# Patient Record
Sex: Female | Born: 1965 | Race: Black or African American | Hispanic: No | Marital: Married | Smoking: Current every day smoker
Health system: Southern US, Community
[De-identification: ages and names within clinical notes are randomized; demographics above are authoritative.]

## PROBLEM LIST (undated history)

## (undated) DIAGNOSIS — M542 Cervicalgia: Secondary | ICD-10-CM

## (undated) DIAGNOSIS — G43709 Chronic migraine without aura, not intractable, without status migrainosus: Secondary | ICD-10-CM

## (undated) DIAGNOSIS — D649 Anemia, unspecified: Secondary | ICD-10-CM

## (undated) DIAGNOSIS — K2100 Gastro-esophageal reflux disease with esophagitis, without bleeding: Secondary | ICD-10-CM

## (undated) DIAGNOSIS — K2970 Gastritis, unspecified, without bleeding: Secondary | ICD-10-CM

## (undated) HISTORY — PX: OTHER SURGICAL HISTORY: SHX169

## (undated) HISTORY — DX: Cervicalgia: M54.2

## (undated) HISTORY — DX: Chronic migraine without aura, not intractable, without status migrainosus: G43.709

## (undated) HISTORY — DX: Gastro-esophageal reflux disease with esophagitis: K21.0

## (undated) HISTORY — DX: Gastro-esophageal reflux disease with esophagitis, without bleeding: K21.00

## (undated) HISTORY — DX: Anemia, unspecified: D64.9

---

## 2009-11-02 ENCOUNTER — Emergency Department (HOSPITAL_COMMUNITY): Admission: EM | Admit: 2009-11-02 | Discharge: 2009-11-02 | Payer: Self-pay | Admitting: Emergency Medicine

## 2010-01-20 ENCOUNTER — Emergency Department (HOSPITAL_COMMUNITY)
Admission: EM | Admit: 2010-01-20 | Discharge: 2010-01-20 | Payer: Self-pay | Source: Home / Self Care | Admitting: Emergency Medicine

## 2010-01-21 ENCOUNTER — Emergency Department (HOSPITAL_COMMUNITY)
Admission: EM | Admit: 2010-01-21 | Discharge: 2010-01-21 | Payer: Self-pay | Source: Home / Self Care | Admitting: Emergency Medicine

## 2010-01-27 LAB — COMPREHENSIVE METABOLIC PANEL
ALT: 17 U/L (ref 0–35)
AST: 21 U/L (ref 0–37)
Albumin: 4.2 g/dL (ref 3.5–5.2)
Alkaline Phosphatase: 82 U/L (ref 39–117)
BUN: 8 mg/dL (ref 6–23)
CO2: 26 mEq/L (ref 19–32)
Calcium: 9.6 mg/dL (ref 8.4–10.5)
Chloride: 99 mEq/L (ref 96–112)
Creatinine, Ser: 0.7 mg/dL (ref 0.4–1.2)
GFR calc Af Amer: >60 mL/min (ref >60)
GFR calc non Af Amer: >60 mL/min (ref >60)
Glucose, Bld: 117 mg/dL — ABNORMAL HIGH (ref 70–99)
Potassium: 3.5 mEq/L (ref 3.5–5.1)
Sodium: 137 mEq/L (ref 135–145)
Total Bilirubin: 0.4 mg/dL (ref 0.3–1.2)
Total Protein: 8.1 g/dL (ref 6.0–8.3)

## 2010-01-27 LAB — DIFFERENTIAL
Basophils Absolute: 0 10*3/uL (ref 0.0–0.1)
Basophils Relative: 0 % (ref 0–1)
Eosinophils Absolute: 0 10*3/uL (ref 0.0–0.7)
Eosinophils Relative: 0 % (ref 0–5)
Lymphocytes Relative: 9 % — ABNORMAL LOW (ref 12–46)
Lymphs Abs: 0.9 10*3/uL (ref 0.7–4.0)
Monocytes Absolute: 0.4 10*3/uL (ref 0.1–1.0)
Monocytes Relative: 4 % (ref 3–12)
Neutro Abs: 8.5 10*3/uL — ABNORMAL HIGH (ref 1.7–7.7)
Neutrophils Relative %: 87 % — ABNORMAL HIGH (ref 43–77)

## 2010-01-27 LAB — URINALYSIS, ROUTINE W REFLEX MICROSCOPIC
Bilirubin Urine: NEGATIVE
Ketones, ur: >80 mg/dL — AB
Nitrite: NEGATIVE
Protein, ur: 30 mg/dL — AB
Specific Gravity, Urine: 1.027 (ref 1.005–1.030)
Urine Glucose, Fasting: NEGATIVE mg/dL
Urobilinogen, UA: 0.2 mg/dL (ref 0.0–1.0)
pH: 7.5 (ref 5.0–8.0)

## 2010-01-27 LAB — CBC
HCT: 37 % (ref 36.0–46.0)
Hemoglobin: 12.1 g/dL (ref 12.0–15.0)
MCH: 28.1 pg (ref 26.0–34.0)
MCHC: 32.7 g/dL (ref 30.0–36.0)
MCV: 86 fL (ref 78.0–100.0)
Platelets: 391 10*3/uL (ref 150–400)
RBC: 4.3 MIL/uL (ref 3.87–5.11)
RDW: 14.3 % (ref 11.5–15.5)
WBC: 9.8 10*3/uL (ref 4.0–10.5)

## 2010-01-27 LAB — POCT PREGNANCY, URINE: Preg Test, Ur: NEGATIVE

## 2010-01-27 LAB — LIPASE, BLOOD: Lipase: 31 U/L (ref 11–59)

## 2010-01-27 LAB — URINE MICROSCOPIC-ADD ON

## 2010-05-10 ENCOUNTER — Emergency Department (HOSPITAL_COMMUNITY)
Admission: EM | Admit: 2010-05-10 | Discharge: 2010-05-10 | Disposition: A | Discharge status: Home / Self Care | Payer: Self-pay | Attending: Emergency Medicine | Admitting: Emergency Medicine

## 2010-05-10 DIAGNOSIS — R300 Dysuria: Secondary | ICD-10-CM | POA: Insufficient documentation

## 2010-05-10 DIAGNOSIS — R109 Unspecified abdominal pain: Secondary | ICD-10-CM | POA: Insufficient documentation

## 2010-05-10 DIAGNOSIS — N12 Tubulo-interstitial nephritis, not specified as acute or chronic: Secondary | ICD-10-CM | POA: Insufficient documentation

## 2010-05-10 DIAGNOSIS — R350 Frequency of micturition: Secondary | ICD-10-CM | POA: Insufficient documentation

## 2010-05-10 LAB — URINALYSIS, ROUTINE W REFLEX MICROSCOPIC
Bilirubin Urine: NEGATIVE
Glucose, UA: NEGATIVE mg/dL
Ketones, ur: NEGATIVE mg/dL
Nitrite: NEGATIVE
Protein, ur: NEGATIVE mg/dL
Specific Gravity, Urine: 1.021 (ref 1.005–1.030)
Urobilinogen, UA: 0.2 mg/dL (ref 0.0–1.0)
pH: 6.5 (ref 5.0–8.0)

## 2010-05-10 LAB — POCT PREGNANCY, URINE: Preg Test, Ur: NEGATIVE

## 2010-05-10 LAB — URINE MICROSCOPIC-ADD ON

## 2011-12-11 ENCOUNTER — Encounter (HOSPITAL_COMMUNITY): Payer: Self-pay | Admitting: *Deleted

## 2011-12-11 ENCOUNTER — Emergency Department (HOSPITAL_COMMUNITY): Payer: Self-pay

## 2011-12-11 ENCOUNTER — Emergency Department (HOSPITAL_COMMUNITY)
Admission: EM | Admit: 2011-12-11 | Discharge: 2011-12-11 | Disposition: A | Discharge status: Home / Self Care | Payer: Self-pay | Attending: Emergency Medicine | Admitting: Emergency Medicine

## 2011-12-11 DIAGNOSIS — F17200 Nicotine dependence, unspecified, uncomplicated: Secondary | ICD-10-CM | POA: Insufficient documentation

## 2011-12-11 DIAGNOSIS — R109 Unspecified abdominal pain: Secondary | ICD-10-CM | POA: Insufficient documentation

## 2011-12-11 DIAGNOSIS — R110 Nausea: Secondary | ICD-10-CM | POA: Insufficient documentation

## 2011-12-11 HISTORY — DX: Gastritis, unspecified, without bleeding: K29.70

## 2011-12-11 LAB — URINALYSIS, ROUTINE W REFLEX MICROSCOPIC
Bilirubin Urine: NEGATIVE
Glucose, UA: NEGATIVE mg/dL
Hgb urine dipstick: NEGATIVE
Ketones, ur: NEGATIVE mg/dL
Nitrite: NEGATIVE
Protein, ur: NEGATIVE mg/dL
Specific Gravity, Urine: 1.021 (ref 1.005–1.030)
Urobilinogen, UA: 0.2 mg/dL (ref 0.0–1.0)
pH: 7.5 (ref 5.0–8.0)

## 2011-12-11 LAB — CBC WITH DIFFERENTIAL/PLATELET
Basophils Absolute: 0 10*3/uL (ref 0.0–0.1)
Basophils Relative: 0 % (ref 0–1)
Eosinophils Absolute: 0.1 10*3/uL (ref 0.0–0.7)
Eosinophils Relative: 2 % (ref 0–5)
HCT: 36.1 % (ref 36.0–46.0)
Hemoglobin: 11.6 g/dL — ABNORMAL LOW (ref 12.0–15.0)
Lymphocytes Relative: 44 % (ref 12–46)
Lymphs Abs: 2.2 10*3/uL (ref 0.7–4.0)
MCH: 27 pg (ref 26.0–34.0)
MCHC: 32.1 g/dL (ref 30.0–36.0)
MCV: 84 fL (ref 78.0–100.0)
Monocytes Absolute: 0.5 10*3/uL (ref 0.1–1.0)
Monocytes Relative: 9 % (ref 3–12)
Neutro Abs: 2.2 10*3/uL (ref 1.7–7.7)
Neutrophils Relative %: 45 % (ref 43–77)
Platelets: 386 10*3/uL (ref 150–400)
RBC: 4.3 MIL/uL (ref 3.87–5.11)
RDW: 14.9 % (ref 11.5–15.5)
WBC: 4.9 10*3/uL (ref 4.0–10.5)

## 2011-12-11 LAB — COMPREHENSIVE METABOLIC PANEL
ALT: 9 U/L (ref 0–35)
AST: 17 U/L (ref 0–37)
Albumin: 3.5 g/dL (ref 3.5–5.2)
Alkaline Phosphatase: 77 U/L (ref 39–117)
BUN: 12 mg/dL (ref 6–23)
CO2: 24 mEq/L (ref 19–32)
Calcium: 9.3 mg/dL (ref 8.4–10.5)
Chloride: 101 mEq/L (ref 96–112)
Creatinine, Ser: 0.71 mg/dL (ref 0.50–1.10)
GFR calc Af Amer: >90 mL/min (ref >90)
GFR calc non Af Amer: >90 mL/min (ref >90)
Glucose, Bld: 89 mg/dL (ref 70–99)
Potassium: 4 mEq/L (ref 3.5–5.1)
Sodium: 135 mEq/L (ref 135–145)
Total Bilirubin: 0.2 mg/dL — ABNORMAL LOW (ref 0.3–1.2)
Total Protein: 7.6 g/dL (ref 6.0–8.3)

## 2011-12-11 LAB — URINE MICROSCOPIC-ADD ON

## 2011-12-11 LAB — PREGNANCY, URINE: Preg Test, Ur: NEGATIVE

## 2011-12-11 MED ORDER — PROMETHAZINE HCL 25 MG/ML IJ SOLN: 12.5000 mg | Freq: Once | INTRAMUSCULAR | Status: DC

## 2011-12-11 MED ORDER — OXYCODONE-ACETAMINOPHEN 5-325 MG PO TABS: 1.0000 | ORAL_TABLET | Freq: Four times a day (QID) | ORAL | Status: DC | PRN

## 2011-12-11 MED ORDER — MORPHINE SULFATE 4 MG/ML IJ SOLN
4.0000 mg | Freq: Once | INTRAMUSCULAR | Status: AC
  Administered 2011-12-11: 4 mg via INTRAVENOUS
  Filled 2011-12-11: qty 1

## 2011-12-11 MED ORDER — SODIUM CHLORIDE 0.9 % IV BOLUS (SEPSIS)
1000.0000 mL | Freq: Once | INTRAVENOUS | Status: AC
  Administered 2011-12-11: 1000 mL via INTRAVENOUS

## 2011-12-11 MED ORDER — ONDANSETRON HCL 4 MG/2ML IJ SOLN
4.0000 mg | Freq: Once | INTRAMUSCULAR | Status: AC
  Administered 2011-12-11: 4 mg via INTRAVENOUS
  Filled 2011-12-11: qty 2

## 2011-12-11 MED ORDER — CIPROFLOXACIN HCL 500 MG PO TABS: 500.0000 mg | ORAL_TABLET | Freq: Two times a day (BID) | ORAL | Status: DC

## 2011-12-11 NOTE — ED Notes (Signed)
Pt states that 3 days ago she began to have severe L lower back and a L hip pain that radiates down her thigh.  No distress noted.  States severe pain with urination and pain with bowel movements (the pain is in her back).  States that her stools have been black.  Last BM today.  Denies pain with urination.

## 2011-12-11 NOTE — ED Notes (Signed)
Pt C/o severe pain in her hip.  Initially she was refusing the morphine due the "feeling" it gave.  But pt became to uncomfortable.  Morphine IV administered.  No distress noted.  Pt placed on SpO2 monitoring for safety due to narcotics.

## 2011-12-11 NOTE — ED Notes (Signed)
Patient transported to CT 

## 2011-12-11 NOTE — ED Provider Notes (Signed)
History     CSN: [id number]  Arrival date & time 12/11/11  7829   First MD Initiated Contact with Patient 12/11/11 [phone]      Chief Complaint  Patient presents with  . Hip Pain    (Consider location/radiation/quality/duration/timing/severity/associated sxs/prior treatment) HPI Comments: Patient presents with pain in the right lower abdomen and flank that started two days ago.  The pain is constant and now becoming quite severe.  She is having difficulty lying on her back and standing up.  These positions seem to worsen the pain.  She denies bowel or bladder complaints.  Patient is a 46 y.o. female presenting with abdominal pain. The history is provided by the patient.  Abdominal Pain The primary symptoms of the illness include abdominal pain and nausea. The primary symptoms of the illness do not include fever, vomiting, diarrhea, dysuria or vaginal discharge. The current episode started 2 days ago. The onset of the illness was gradual. The problem has been rapidly worsening.  The patient states that she believes she is currently not pregnant. The patient has not had a change in bowel habit. Symptoms associated with the illness do not include chills, constipation, urgency or frequency.    Past Medical History  Diagnosis Date  . Gastritis     No past surgical history on file.  No family history on file.  History  Substance Use Topics  . Smoking status: Current Every Day Smoker  . Smokeless tobacco: Not on file  . Alcohol Use: No    OB History    Grav Para Term Preterm Abortions TAB SAB Ect Mult Living                  Review of Systems  Constitutional: Negative for fever and chills.  Gastrointestinal: Positive for nausea and abdominal pain. Negative for vomiting, diarrhea and constipation.  Genitourinary: Negative for dysuria, urgency, frequency and vaginal discharge.  All other systems reviewed and are negative.    Allergies  Aspirin  Home Medications    Current Outpatient Rx  Name  Route  Sig  Dispense  Refill  . ACETAMINOPHEN 500 MG PO TABS   Oral   Take 1,500 mg by mouth 3 (three) times daily as needed. For pain         . NAPROXEN SODIUM 220 MG PO TABS   Oral   Take 660 mg by mouth 2 (two) times daily as needed. For pain           BP 128/65  Pulse 84  Temp 98.1 F (36.7 C) (Oral)  Resp 18  Ht 5\' 3"  (1.6 m)  Wt 180 lb (81.647 kg)  BMI 31.89 kg/m2  LMP 12/04/2011  Physical Exam  Nursing note and vitals reviewed. Constitutional: She is oriented to person, place, and time. She appears well-developed and well-nourished. No distress.  HENT:  Head: Normocephalic and atraumatic.  Neck: Normal range of motion. Neck supple.  Cardiovascular: Normal rate and regular rhythm.  Exam reveals no gallop and no friction rub.   No murmur heard. Pulmonary/Chest: Effort normal and breath sounds normal. No respiratory distress. She has no wheezes.  Abdominal: Soft. Bowel sounds are normal. She exhibits no distension.       There is ttp in the right lower abdomen and flank.  There is no rebound but she is exhibiting voluntary guarding.  Musculoskeletal: Normal range of motion.  Neurological: She is alert and oriented to person, place, and time.  Skin: Skin is warm and  dry. She is not diaphoretic.    ED Course  Procedures (including critical care time)   Labs Reviewed  CBC WITH DIFFERENTIAL  COMPREHENSIVE METABOLIC PANEL  URINALYSIS, ROUTINE W REFLEX MICROSCOPIC  PREGNANCY, URINE   No results found.   No diagnosis found.    MDM  The patient presents with right lower quadrant and flank pain, but workup including ct, ua, and labs are unremarkable.  There is a small amount of leukocytes in the urine.  This could potentially be related to pyelonephritis and I will treat with antibiotics and pain meds as I have found nothing else to explain her symptoms.          Geoffery Lyons, MD 12/11/11 [phone]

## 2012-03-01 ENCOUNTER — Emergency Department (INDEPENDENT_AMBULATORY_CARE_PROVIDER_SITE_OTHER): Payer: 59

## 2012-03-01 ENCOUNTER — Encounter (HOSPITAL_COMMUNITY): Payer: Self-pay | Admitting: Emergency Medicine

## 2012-03-01 ENCOUNTER — Emergency Department (HOSPITAL_COMMUNITY)
Admission: EM | Admit: 2012-03-01 | Discharge: 2012-03-01 | Disposition: A | Discharge status: Home / Self Care | Payer: 59 | Source: Home / Self Care

## 2012-03-01 DIAGNOSIS — S93409A Sprain of unspecified ligament of unspecified ankle, initial encounter: Secondary | ICD-10-CM

## 2012-03-01 DIAGNOSIS — S93401A Sprain of unspecified ligament of right ankle, initial encounter: Secondary | ICD-10-CM

## 2012-03-01 NOTE — ED Notes (Signed)
Reports right ankle pain after fall on ice.

## 2012-03-01 NOTE — ED Notes (Signed)
MD at bedside. 

## 2012-03-01 NOTE — ED Provider Notes (Signed)
Medical screening examination/treatment/procedure(s) were performed by resident physician or non-physician practitioner and as supervising physician I was immediately available for consultation/collaboration.   Abriella Filkins DOUGLAS MD.   Lenice Koper D Marcelis Wissner, MD 03/01/12 2001 

## 2012-03-01 NOTE — ED Provider Notes (Signed)
History     CSN: [id number]  Arrival date & time 03/01/12  1237   First MD Initiated Contact with Patient 03/01/12 1315      Chief Complaint  Patient presents with  . Fall    (Consider location/radiation/quality/duration/timing/severity/associated sxs/prior treatment) HPI Comments: This 47 year old female was in the parking lot at [employer] 4 days ago and slipped on the ice. She states that she injured her right ankle at that time that she continued to walk on her right lower extremity. The following 4 days she has continued to bear weight, ambulates and climbing descend stairs. She is now complaining of persistent tenderness and swelling of the ankle. She denies pain or tenderness of the foot. She also denies injury to the head neck, chest, back, abdomen or other joints.   Past Medical History  Diagnosis Date  . Gastritis     History reviewed. No pertinent past surgical history.  No family history on file.  History  Substance Use Topics  . Smoking status: Current Every Day Smoker  . Smokeless tobacco: Not on file  . Alcohol Use: No    OB History   Grav Para Term Preterm Abortions TAB SAB Ect Mult Living                  Review of Systems  Constitutional: Negative for fever, chills and activity change.  HENT: Negative.   Respiratory: Negative.   Cardiovascular: Negative.   Musculoskeletal:       As per HPI  Skin: Negative for color change, pallor and rash.  Neurological: Negative.     Allergies  Aspirin  Home Medications   Current Outpatient Rx  Name  Route  Sig  Dispense  Refill  . acetaminophen (TYLENOL) 500 MG tablet   Oral   Take 1,500 mg by mouth 3 (three) times daily as needed. For pain         . ciprofloxacin (CIPRO) 500 MG tablet   Oral   Take 1 tablet (500 mg total) by mouth every 12 (twelve) hours.   10 tablet   0   . naproxen sodium (ANAPROX) 220 MG tablet   Oral   Take 660 mg by mouth 2 (two) times daily as needed. For pain          . oxyCODONE-acetaminophen (PERCOCET/ROXICET) 5-325 MG per tablet   Oral   Take 1-2 tablets by mouth every 6 (six) hours as needed for pain.   20 tablet   0     BP 130/75  Pulse 100  Temp(Src) 98.2 F (36.8 C) (Oral)  Resp 18  SpO2 100%  LMP 02/16/2012  Physical Exam  Nursing note and vitals reviewed. Constitutional: She is oriented to person, place, and time. She appears well-developed and well-nourished. No distress.  HENT:  Head: Normocephalic and atraumatic.  Eyes: EOM are normal. Pupils are equal, round, and reactive to light.  Neck: Normal range of motion. Neck supple.  Cardiovascular: Normal rate.   Pulmonary/Chest: Effort normal.  Musculoskeletal:  There is tenderness over the right lateral malleolus and anterior ankle. There is also mild swelling which has improved since a day after the injury. Tenderness in the proximal ankle along the tendon of the posterior aspect of the lower most leg. It does not involve the calf muscle. No tenderness or swelling of the foot. She is able to dorsiflex, plantar flex and deviate medially and laterally with her ankle. Neurovascular and motor sensory is intact.  Lymphadenopathy:  She has no cervical adenopathy.  Neurological: She is alert and oriented to person, place, and time. No cranial nerve deficit.  Skin: Skin is warm and dry.  Psychiatric: Her mood appears anxious.    ED Course  Procedures (including critical care time)  Labs Reviewed - No data to display Dg Ankle Complete Right  03/01/2012  *RADIOLOGY REPORT*  Clinical Data: Pain post trauma  RIGHT ANKLE - COMPLETE 3+ VIEW  Comparison: None.  Findings:  Frontal, oblique, and lateral views were obtained. There is swelling laterally.  No fracture or effusion.  Ankle mortise appears intact.  There is a spur arising from the inferior calcaneus.  IMPRESSION: Swelling laterally.  No apparent fracture.  Mortise intact.   Original Report Authenticated By: Bretta Bang, M.D.       1. Ankle sprain, right, initial encounter       MDM  Rest, ice, wear the wrap for one week, keep elevated. Stay off of it as much as possible and use the crutches. Initially no weightbearing and then had weight as tolerated. Ibuprofen 600 mg every 6 hours as needed for pain.         Hayden Rasmussen, NP 03/01/12 [phone]

## 2012-05-05 ENCOUNTER — Encounter: Payer: Self-pay | Admitting: Nurse Practitioner

## 2012-07-20 ENCOUNTER — Encounter: Payer: Self-pay | Admitting: *Deleted

## 2012-07-20 ENCOUNTER — Ambulatory Visit (INDEPENDENT_AMBULATORY_CARE_PROVIDER_SITE_OTHER): Payer: 59 | Admitting: Nurse Practitioner

## 2012-07-20 ENCOUNTER — Encounter: Payer: Self-pay | Admitting: Nurse Practitioner

## 2012-07-20 VITALS — BP 124/88 | HR 71 | Temp 98.7°F | Resp 15 | Ht 63.0 in | Wt 177.0 lb

## 2012-07-20 DIAGNOSIS — H6690 Otitis media, unspecified, unspecified ear: Secondary | ICD-10-CM

## 2012-07-20 DIAGNOSIS — H6691 Otitis media, unspecified, right ear: Secondary | ICD-10-CM

## 2012-07-20 MED ORDER — AMOXICILLIN-POT CLAVULANATE 875-125 MG PO TABS: 1.0000 | ORAL_TABLET | Freq: Two times a day (BID) | ORAL | Status: DC

## 2012-07-20 NOTE — Progress Notes (Signed)
Patient ID: Candice Murray, female   DOB: [birth year], 47 y.o.   MRN: [id number]   Allergies  Allergen Reactions  . Aspirin Other (See Comments)    Causes stomach inflammation  . Codeine     Chief Complaint  Patient presents with  . Otalgia    Right Ear Pain    HPI: Patient is a 47 y.o. female seen in the office today for pain in right inner ear that is ongoing for 3-4 days.  Reports pain on side of face with headache. No fevers or chills, no drainage or hearing loss Reports her sinuses feel full on the side.   Review of Systems:  Review of Systems  Constitutional: Negative for fever and chills.  HENT: Positive for ear pain. Negative for congestion, sore throat, tinnitus and ear discharge.   Eyes: Negative.   Respiratory: Negative for shortness of breath.   Cardiovascular: Negative for chest pain.  Skin: Negative.      Past Medical History  Diagnosis Date  . Gastritis   . Reflux esophagitis   . Cervicalgia   . Chronic migraine without aura    Past Surgical History  Procedure Laterality Date  . Atopic pregnancy     Social History:   reports that she has been smoking.  She does not have any smokeless tobacco history on file. She reports that she uses illicit drugs (Marijuana). She reports that she does not drink alcohol.  History reviewed. No pertinent family history.  Medications: Patient's Medications  New Prescriptions   No medications on file  Previous Medications   ACETAMINOPHEN (TYLENOL) 500 MG TABLET    Take 1,500 mg by mouth 3 (three) times daily as needed. For pain   NAPROXEN SODIUM (ANAPROX) 220 MG TABLET    Take 660 mg by mouth 2 (two) times daily as needed. For pain   PANTOPRAZOLE (PROTONIX) 40 MG TABLET    Take 40 mg by mouth daily. Take 1 tablet daily to reduce stomach acid.   PROPRANOLOL (INDERAL) 40 MG TABLET    Take 40 mg by mouth 3 (three) times daily. Take 1 tablet twice daily to help control migraines.   SUMATRIPTAN (IMITREX) 50 MG TABLET    Take 50  mg by mouth every 2 (two) hours as needed for migraine. Take 1 tablet as needed with onset of migraine headache. May repeat x 1 after 2 hours -max of 4 pills in 24 hours.  Modified Medications   No medications on file  Discontinued Medications   CIPROFLOXACIN (CIPRO) 500 MG TABLET    Take 1 tablet (500 mg total) by mouth every 12 (twelve) hours.   OXYCODONE-ACETAMINOPHEN (PERCOCET/ROXICET) 5-325 MG PER TABLET    Take 1-2 tablets by mouth every 6 (six) hours as needed for pain.     Physical Exam:  Filed Vitals:   07/20/12 1531  BP: 124/88  Pulse: 71  Temp: 98.7 F (37.1 C)  TempSrc: Oral  Resp: 15  Height: 5\' 3"  (1.6 m)  Weight: 177 lb (80.287 kg)    Physical Exam  Vitals reviewed. Constitutional: She is well-developed, well-nourished, and in no distress. No distress.  HENT:  Right Ear: There is tenderness. No drainage or swelling. No foreign bodies. Tympanic membrane is not erythematous. No middle ear effusion.  Wax in bilateral canal   Cardiovascular: Normal rate, regular rhythm and normal heart sounds.   Pulmonary/Chest: Effort normal and breath sounds normal.  Skin: She is not diaphoretic.     Labs reviewed: Basic Metabolic Panel:  Recent Labs  12/11/11 0926  NA 135  K 4.0  CL 101  CO2 24  GLUCOSE 89  BUN 12  CREATININE 0.71  CALCIUM 9.3   Liver Function Tests:  Recent Labs  12/11/11 0926  AST 17  ALT 9  ALKPHOS 77  BILITOT 0.2*  PROT 7.6  ALBUMIN 3.5   No results found for this basename: LIPASE, AMYLASE,  in the last 8760 hours No results found for this basename: AMMONIA,  in the last 8760 hours CBC:  Recent Labs  12/11/11 0926  WBC 4.9  NEUTROABS 2.2  HGB 11.6*  HCT 36.1  MCV 84.0  PLT 386    Assessment/Plan  Otitis media-  rx given for Augmentin BID for 10 days; pt to follow up if pain has not resolved in 2 weeks or symptoms get worse including fevers or chills

## 2012-07-20 NOTE — Patient Instructions (Addendum)
Antibiotics sent to pharmacy  Take twice daily Taking with probiotic  Follow up in 2 weeks if not resolved  Otitis Media, Adult A middle ear infection is an infection in the space behind the eardrum. The medical name for this is "otitis media." It may happen after a common cold. It is caused by a germ that starts growing in that space. You may feel swollen glands in your neck on the side of the ear infection. HOME CARE INSTRUCTIONS   Take your medicine as directed until it is gone, even if you feel better after the first few days.  Only take over-the-counter or prescription medicines for pain, discomfort, or fever as directed by your caregiver.  Occasional use of a nasal decongestant a couple times per day may help with discomfort and help the eustachian tube to drain better. Follow up with your caregiver in 10 to 14 days or as directed, to be certain that the infection has cleared. Not keeping the appointment could result in a chronic or permanent injury, pain, hearing loss and disability. If there is any problem keeping the appointment, you must call back to this facility for assistance. SEEK IMMEDIATE MEDICAL CARE IF:   You are not getting better in 2 to 3 days.  You have pain that is not controlled with medication.  You feel worse instead of better.  You cannot use the medication as directed.  You develop swelling, redness or pain around the ear or stiffness in your neck. MAKE SURE YOU:   Understand these instructions.  Will watch your condition.  Will get help right away if you are not doing well or get worse. Document Released: 10/04/2003 Document Revised: 03/23/2011 Document Reviewed: 08/05/2007 [facility] Patient Information 2014 [place], [place].

## 2012-08-08 ENCOUNTER — Other Ambulatory Visit: Payer: Self-pay | Admitting: Geriatric Medicine

## 2012-08-08 MED ORDER — PANTOPRAZOLE SODIUM 40 MG PO TBEC: 40.0000 mg | DELAYED_RELEASE_TABLET | Freq: Every day | ORAL | Status: DC

## 2012-08-29 ENCOUNTER — Ambulatory Visit: Payer: Self-pay | Admitting: Nurse Practitioner

## 2012-08-30 ENCOUNTER — Ambulatory Visit: Payer: Self-pay | Admitting: Nurse Practitioner

## 2013-04-12 ENCOUNTER — Ambulatory Visit: Payer: Self-pay | Admitting: Nurse Practitioner

## 2013-04-19 ENCOUNTER — Ambulatory Visit: Payer: Self-pay | Admitting: Nurse Practitioner

## 2013-06-15 IMAGING — CT CT ABD-PELV W/O CM
2 of 4 series · 17 of 46 positions shown, 19 images · non-contrast
Comparison: None.

CLINICAL DATA: Severe left lower back pain and left hip pain.  Pain
with urination.

CT ABDOMEN AND PELVIS WITHOUT CONTRAST
TECHNIQUE: Multidetector CT imaging of the abdomen and pelvis was
performed following the standard protocol without intravenous
contrast.

[Series 2: stone 130 5.0 b31f st · axial · 0.68mm/px · z∈[-400,+20]mm · 14 of 92 slices shown, 16 images]
[im 4/92  soft-tissue]
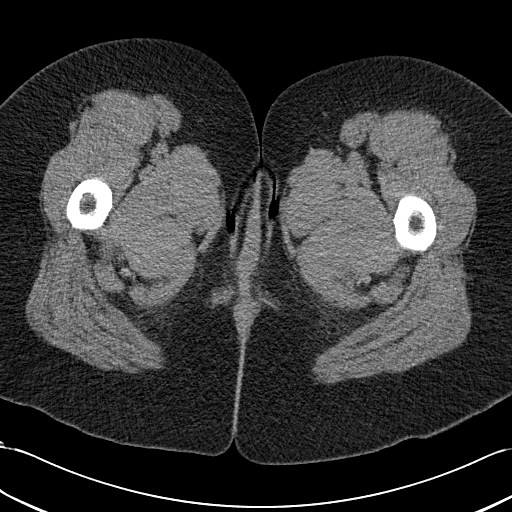
[im 4/92  bone]
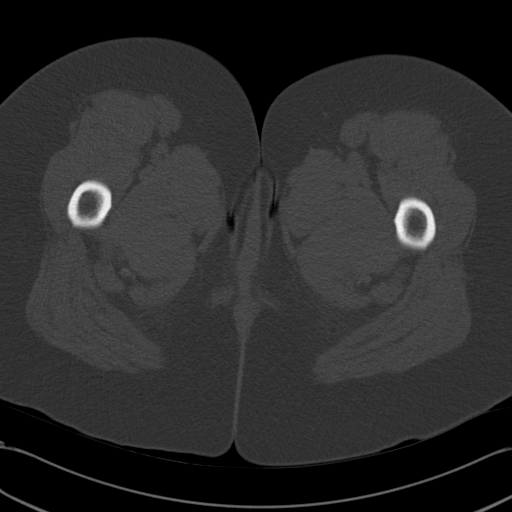
[im 12/92  soft-tissue]
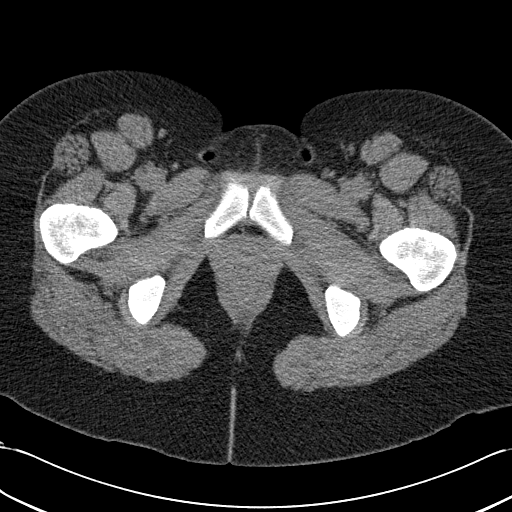
[im 19/92  soft-tissue]
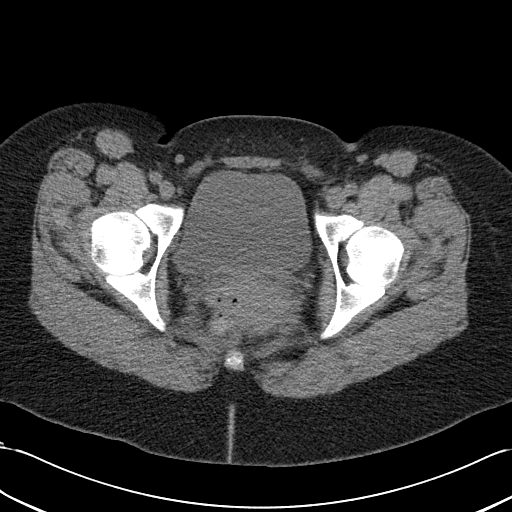
[im 23/92  soft-tissue]
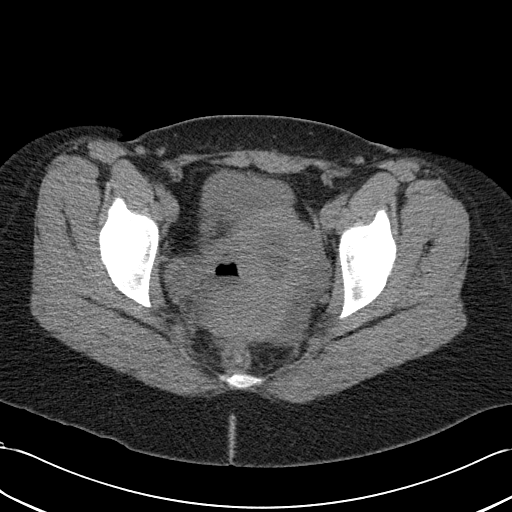
[im 31/92  soft-tissue]
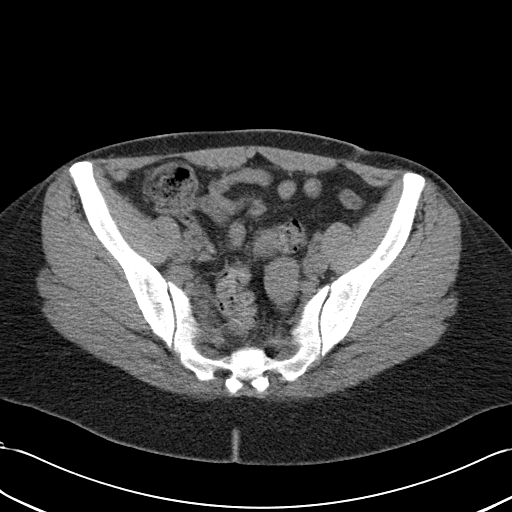
[im 38/92  soft-tissue]
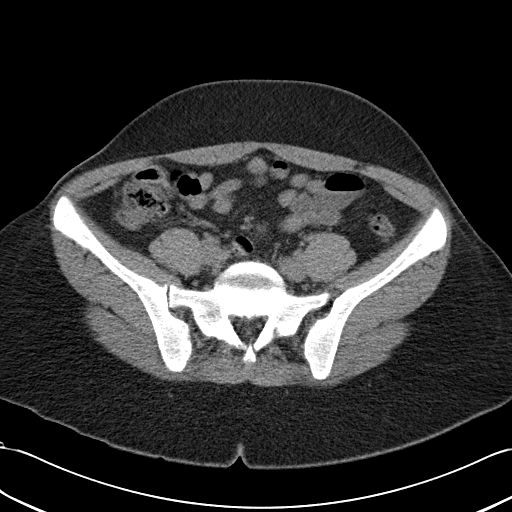
[im 42/92  soft-tissue]
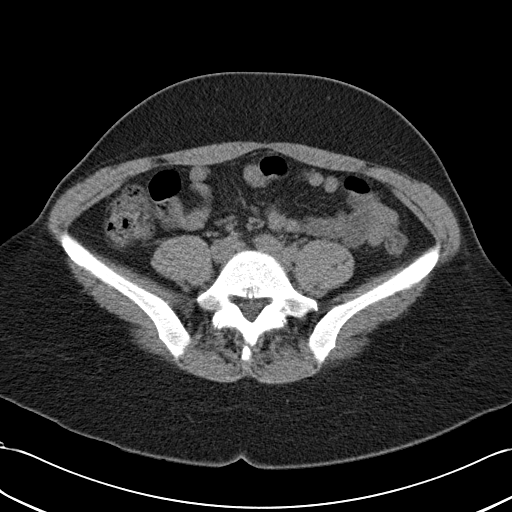
[im 50/92  soft-tissue]
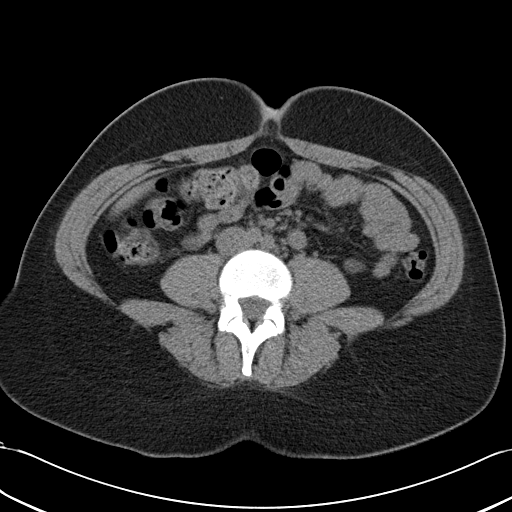
[im 54/92  soft-tissue]
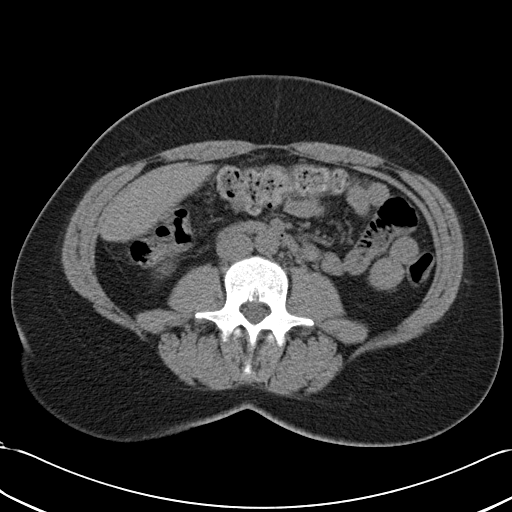
[im 54/92  bone]
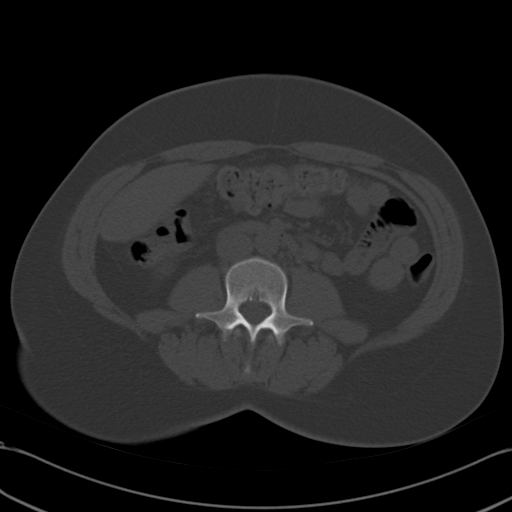
[im 61/92  soft-tissue]
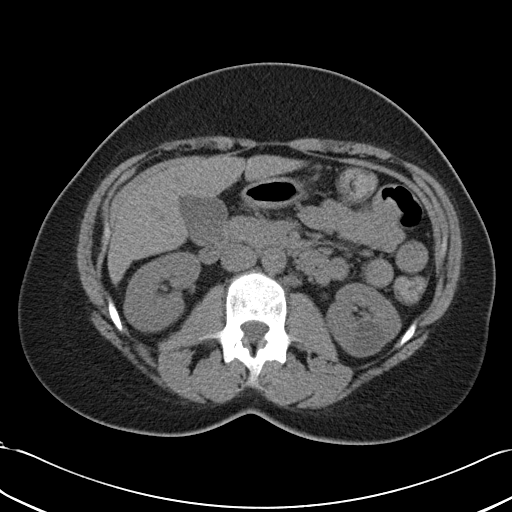
[im 69/92  soft-tissue]
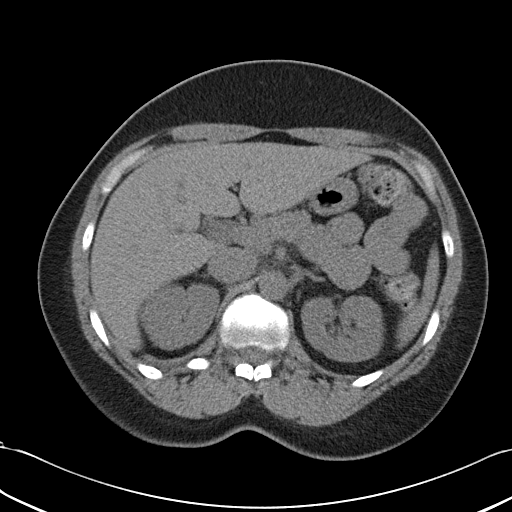
[im 73/92  soft-tissue]
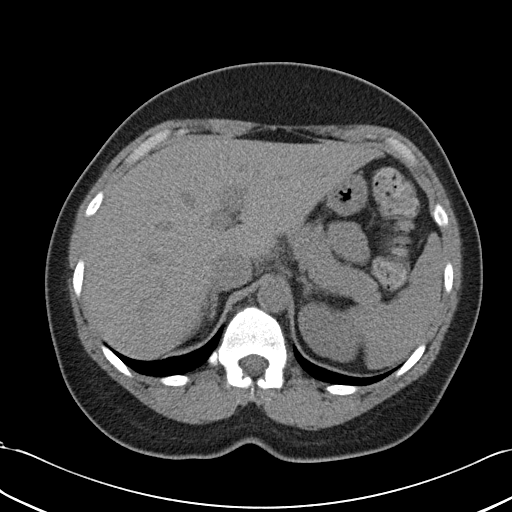
[im 80/92  soft-tissue]
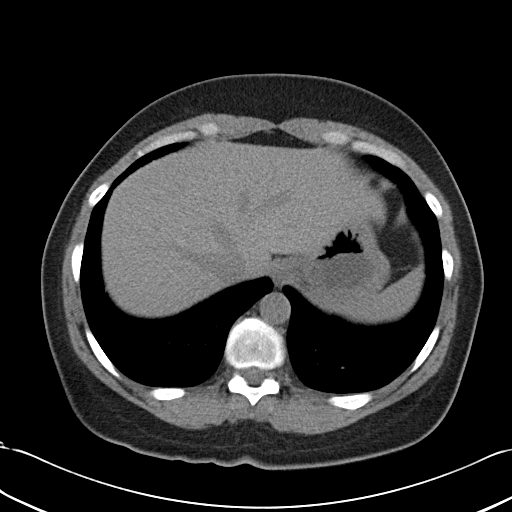
[im 88/92  soft-tissue]
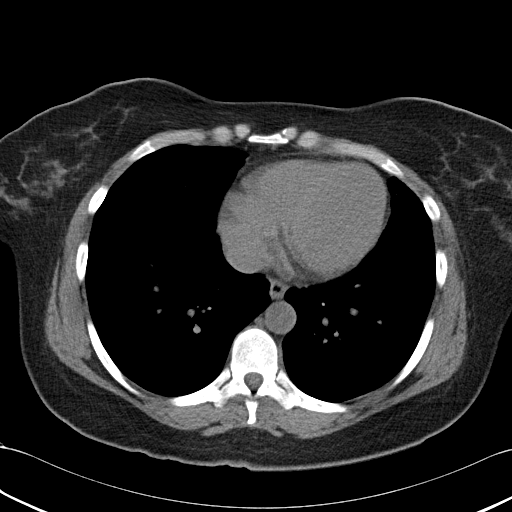

[Series 5: coronals cor · coronal · 0.86mm/px · 3 of 64 slices shown]
[im 22/64  soft-tissue]
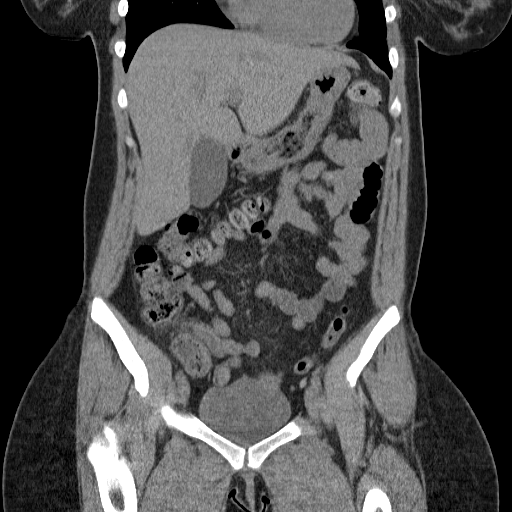
[im 29/64  soft-tissue]
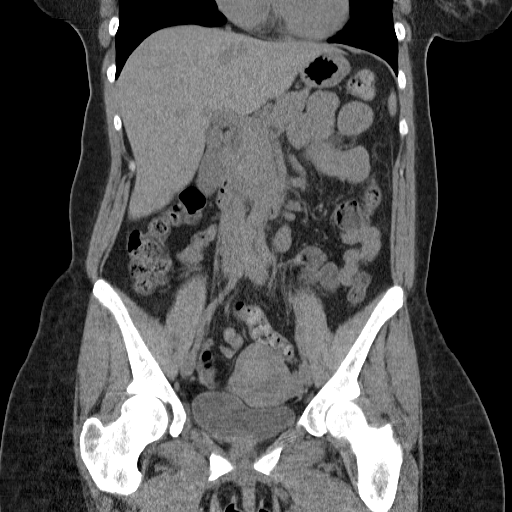
[im 36/64  soft-tissue]
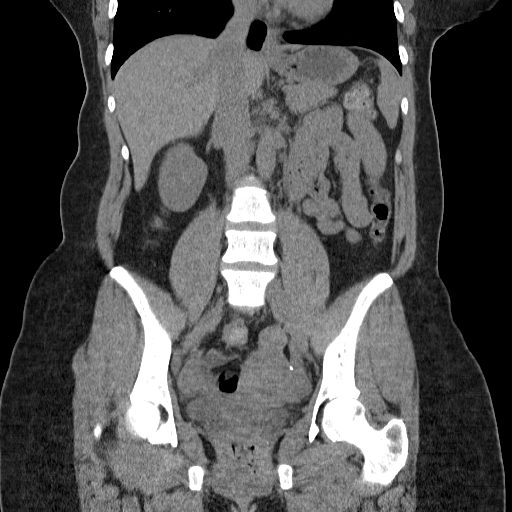

[17 of 46 positions shown; findings below may reference images not displayed]

FINDINGS: Lung bases are clear.  No pericardial fluid.  No focal
hepatic lesion.  The gallbladder, pancreas, spleen, adrenal glands,
kidneys are normal.  There is a low attenuation cyst within the
inferior aspect of the left kidney.  There is no nephrolithiasis or
ureterolithiasis.

The stomach, small bowel, appendix, and cecum are normal.  The
colon and rectosigmoid colon are normal.

Abdominal aorta normal caliber.  No retroperitoneal periportal
lymphadenopathy.  Trace free fluid the pelvis.  Uterus is normal.
The bladder is normal.  No distal ureteral stones bladder stones.
There is a linear high density structure adjacent to  left cornu of
the uterus measuring 2.5 cm and several millimeters thick (image
66)   This is of unclear etiology.

There is no adenopathy in the pelvis. Review of  bone windows
demonstrates no aggressive osseous lesions.
IMPRESSION: 1..  No nephrolithiasis or ureterolithiasis.  No obstructive
uropathy.
2.  Low density in the cystic lesion in the left kidney is likely
benign but cannot be fully characterized without IV contrast.
3.  Linear high density material in the left adnexa  of unclear
etiology.  This could represent a contraceptive device.  Recommend
clinical correlation.

## 2013-09-04 IMAGING — CR DG ANKLE COMPLETE 3+V*R*
3 series · 3 of 3 positions shown · non-contrast
Comparison: None.

CLINICAL DATA: Pain post trauma

RIGHT ANKLE - COMPLETE 3+ VIEW

[view not recorded (1 of 3)]
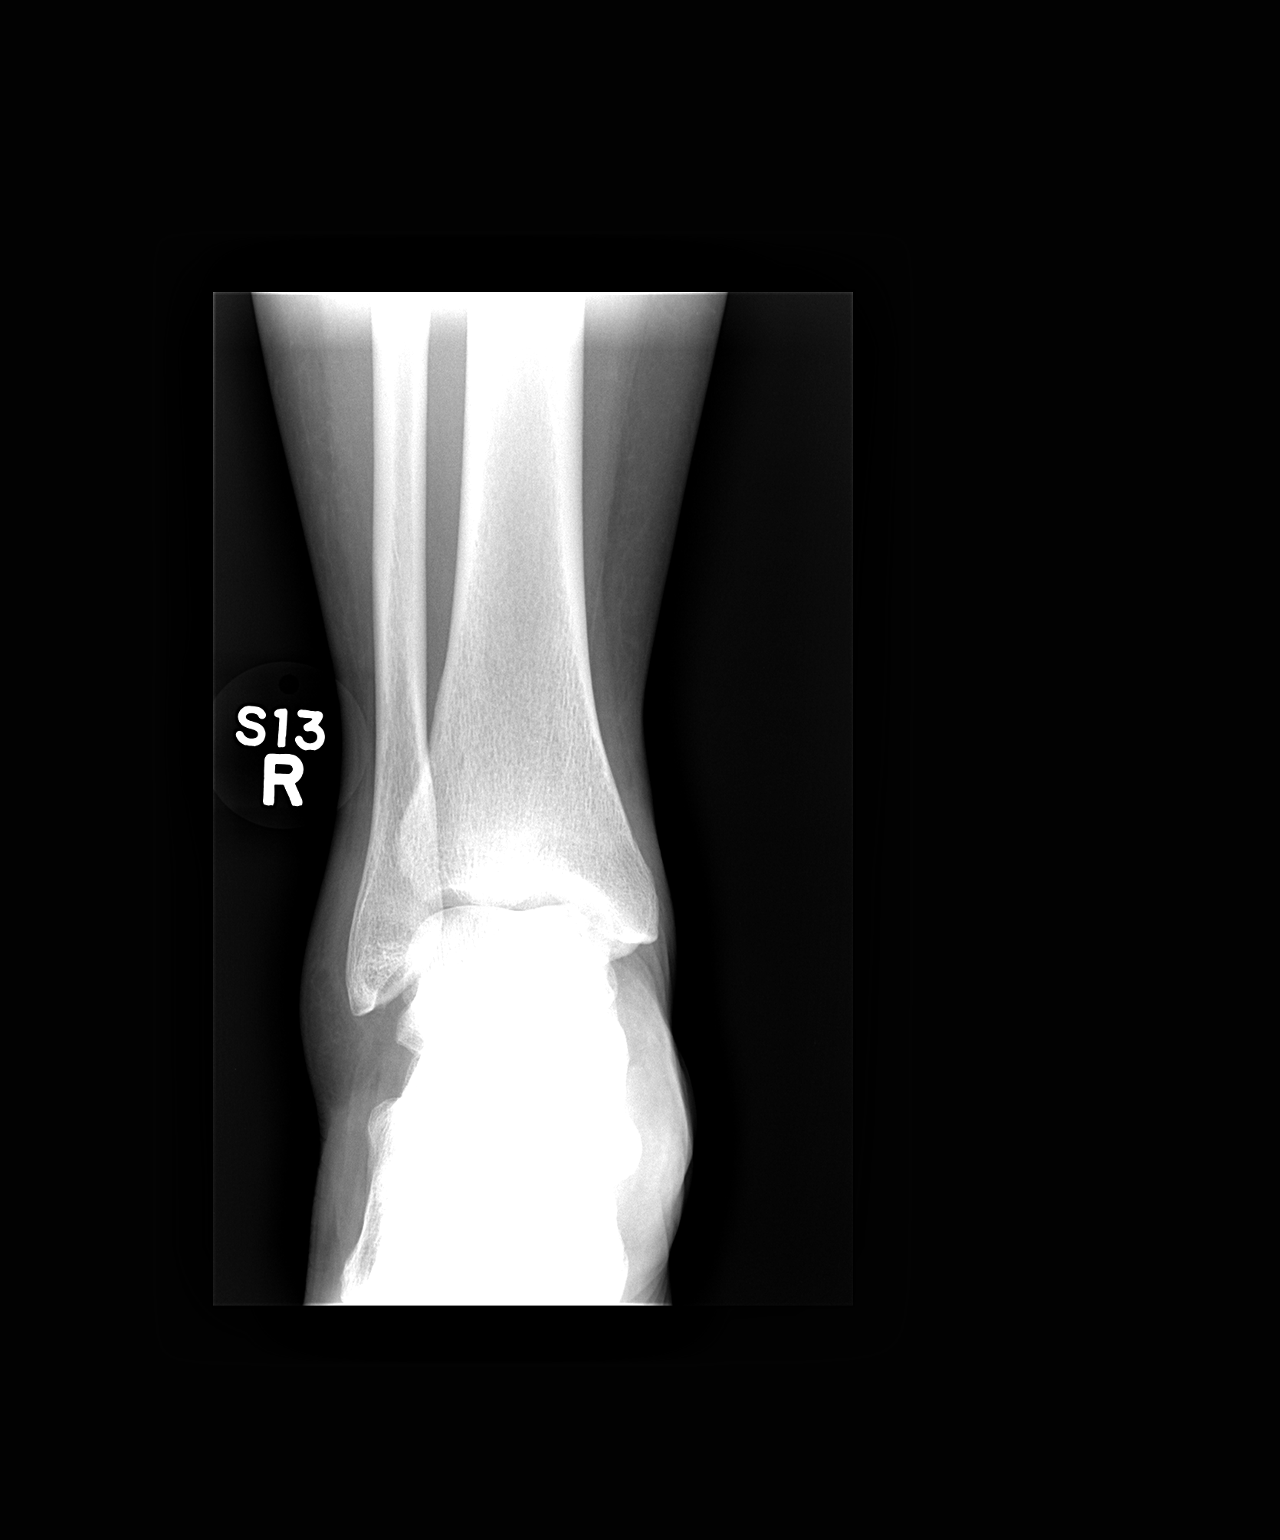

[view not recorded (2 of 3)]
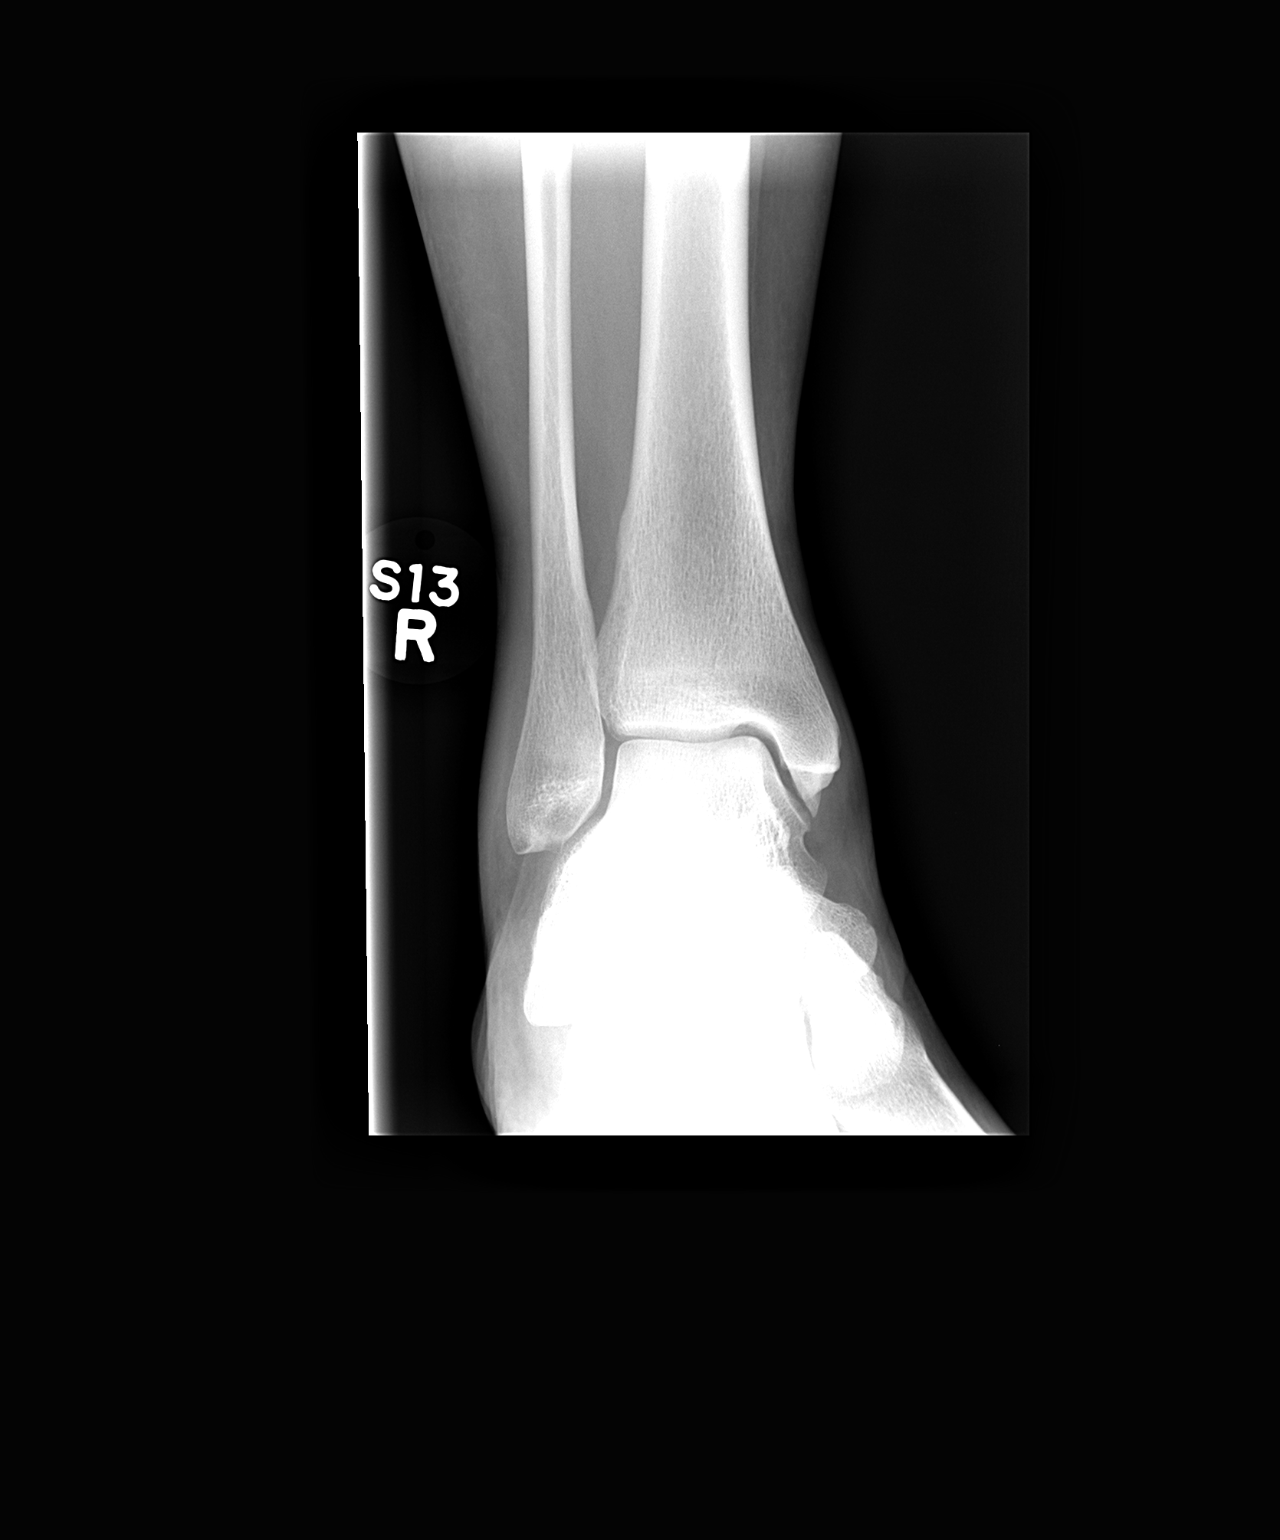

[view not recorded (3 of 3)]
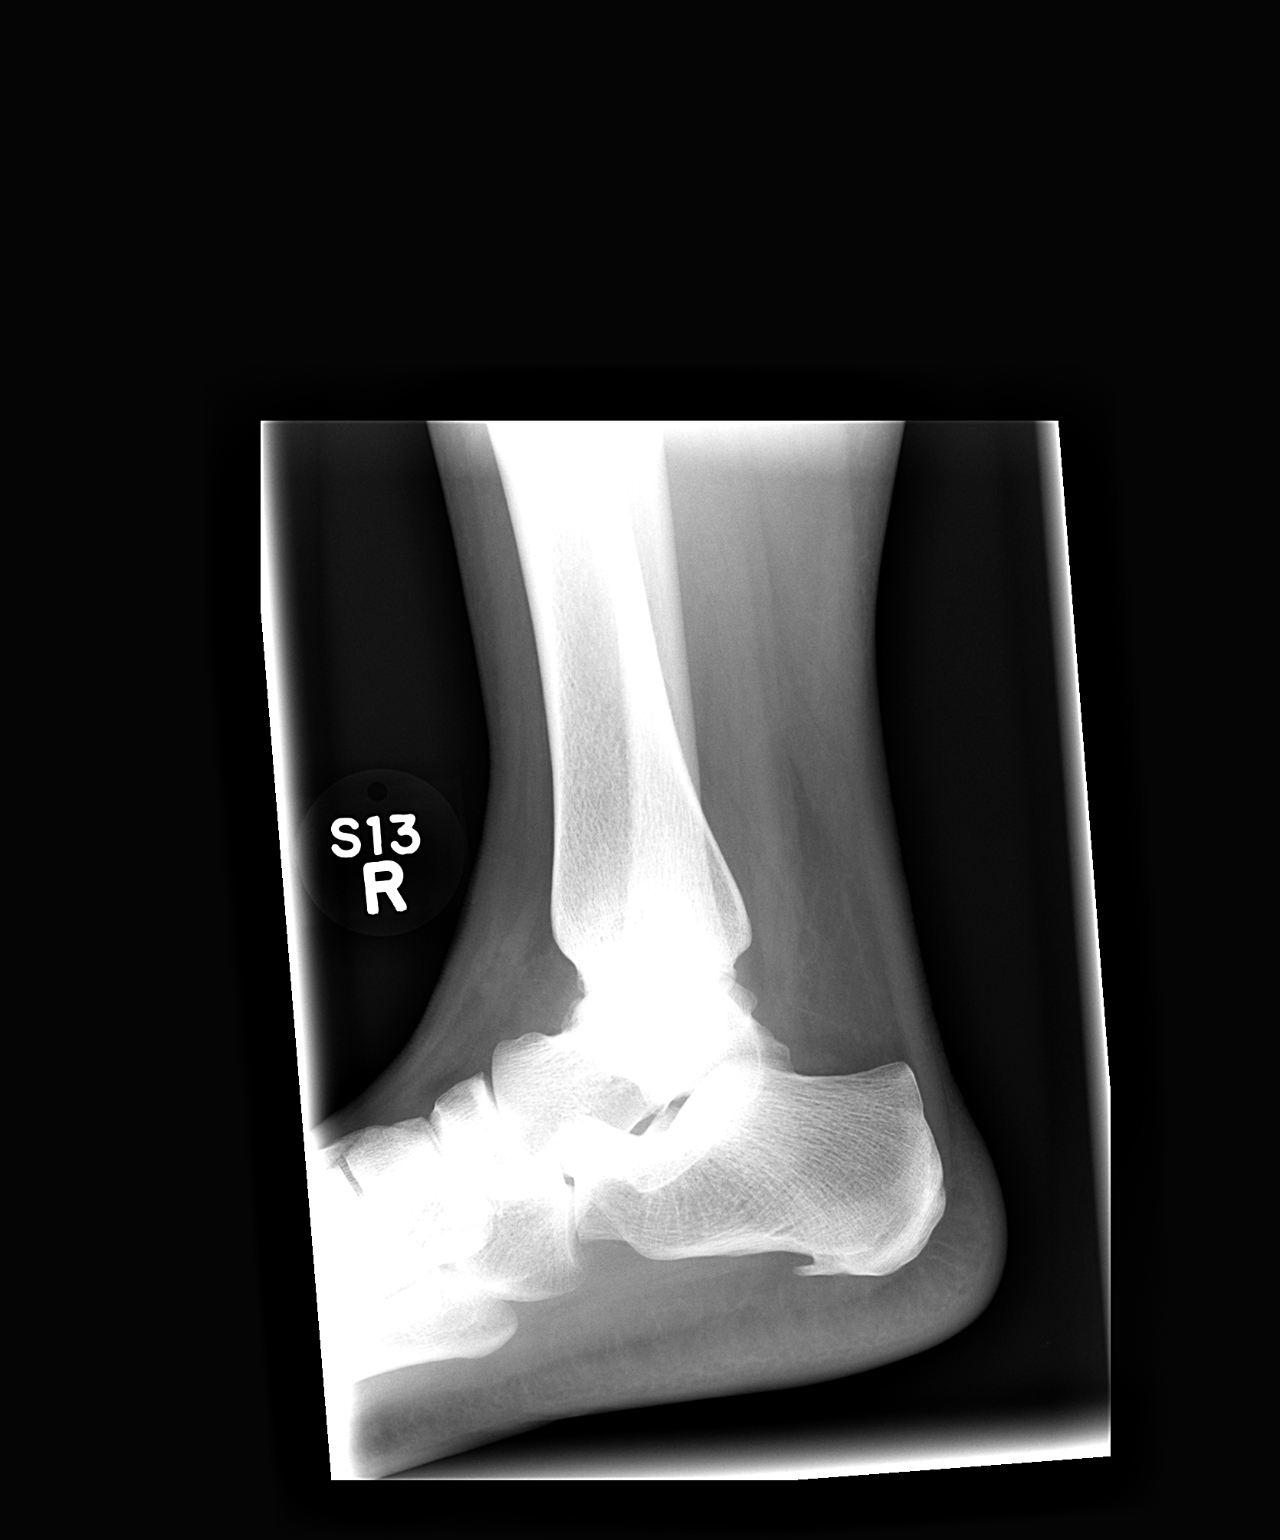

[3 of 3 positions shown; findings below may reference images not displayed]

FINDINGS: Frontal, oblique, and lateral views were obtained. There
is swelling laterally.  No fracture or effusion.  Ankle mortise
appears intact.  There is a spur arising from the inferior
calcaneus.
IMPRESSION: Swelling laterally.  No apparent fracture.  Mortise intact.

## 2014-06-19 ENCOUNTER — Ambulatory Visit (INDEPENDENT_AMBULATORY_CARE_PROVIDER_SITE_OTHER): Payer: 59 | Admitting: Nurse Practitioner

## 2014-06-19 ENCOUNTER — Encounter: Payer: Self-pay | Admitting: Nurse Practitioner

## 2014-06-19 ENCOUNTER — Telehealth: Payer: Self-pay

## 2014-06-19 VITALS — BP 110/76 | HR 90 | Temp 98.4°F | Resp 18 | Ht 63.0 in | Wt 176.4 lb

## 2014-06-19 DIAGNOSIS — G43119 Migraine with aura, intractable, without status migrainosus: Secondary | ICD-10-CM | POA: Diagnosis not present

## 2014-06-19 DIAGNOSIS — Z0000 Encounter for general adult medical examination without abnormal findings: Secondary | ICD-10-CM | POA: Diagnosis not present

## 2014-06-19 DIAGNOSIS — Z1239 Encounter for other screening for malignant neoplasm of breast: Secondary | ICD-10-CM

## 2014-06-19 NOTE — Progress Notes (Signed)
Patient ID: Shalaina Guardiola, female   DOB: 1965/12/26, 49 y.o.   MRN: [id number]    PCP: Sharon Seller, NP  Allergies  Allergen Reactions  . Aspirin Other (See Comments)    Causes stomach inflammation  . Codeine     Chief Complaint  Patient presents with  . Annual Exam    Annual exam,fill out employment form       HPI: Patient is a 49 y.o. female seen in the office today for annual exam, needs physical for work. Has not been seen in 2 years at our office. Last PAP in 2013 which was normal  Screenings: Colon Cancer- will start at 52 Breast Cancer- needs mammogram  Cervical Cancer- 2013   Vaccines Up to date on: influenza, Tdap Does not think she has ever had pneumococcal  Smoking status: current smoker, 1/3 pack day Alcohol use: none  Dentist: did not go routinely, has been going now, having top teeth pulled, getting dentures Ophthalmologist: does not go  Exercise regimen: does not exercise, moderate activity with her job  Diet: does not follow any diet.   Having headaches daily, propranolol and Imitrex were not effective for headaches, taking tylenol daily which dulls the headache but does not take it completely away. Does have sensitivity to light and sound when it gets bad, will also have nausea Strong family hx of migraines, mother has them, all 3 of her sisters have migraines, unsure what they take to help them.     Advanced Directive information Does patient have an advance directive?: No, Would patient like information on creating an advanced directive?: Yes - Educational materials given Review of Systems:  Review of Systems  Constitutional: Negative for activity change, appetite change, fatigue and unexpected weight change.  HENT: Negative for congestion and hearing loss.   Eyes: Negative.   Respiratory: Negative for cough and shortness of breath.   Cardiovascular: Negative for chest pain, palpitations and leg swelling.  Gastrointestinal: Negative for  abdominal pain, diarrhea and constipation.  Genitourinary: Negative for dysuria and difficulty urinating.  Musculoskeletal: Negative for myalgias and arthralgias.  Skin: Negative for color change and wound.  Neurological: Positive for headaches (migraines with nausea). Negative for dizziness, weakness and light-headedness.  Psychiatric/Behavioral: Negative for behavioral problems, confusion and agitation.    Past Medical History  Diagnosis Date  . Gastritis   . Reflux esophagitis   . Cervicalgia   . Chronic migraine without aura    Past Surgical History  Procedure Laterality Date  . Atopic pregnancy     Social History:   reports that she has been smoking.  She has never used smokeless tobacco. She reports that she uses illicit drugs (Marijuana). She reports that she does not drink alcohol.  History reviewed. No pertinent family history.  Medications: Patient's Medications  New Prescriptions   No medications on file  Previous Medications   ACETAMINOPHEN (TYLENOL) 500 MG TABLET    Take 1,500 mg by mouth 3 (three) times daily as needed. For pain   NAPROXEN SODIUM (ANAPROX) 220 MG TABLET    Take 660 mg by mouth 2 (two) times daily as needed. For pain  Modified Medications   No medications on file  Discontinued Medications   AMOXICILLIN-CLAVULANATE (AUGMENTIN) 875-125 MG PER TABLET    Take 1 tablet by mouth 2 (two) times daily.   PANTOPRAZOLE (PROTONIX) 40 MG TABLET    Take 1 tablet (40 mg total) by mouth daily. Take 1 tablet daily to reduce stomach acid.  PROPRANOLOL (INDERAL) 40 MG TABLET    Take 40 mg by mouth 3 (three) times daily. Take 1 tablet twice daily to help control migraines.   SUMATRIPTAN (IMITREX) 50 MG TABLET    Take 50 mg by mouth every 2 (two) hours as needed for migraine. Take 1 tablet as needed with onset of migraine headache. May repeat x 1 after 2 hours -max of 4 pills in 24 hours.     Physical Exam:  Filed Vitals:   06/19/14 0854  BP: 110/76  Pulse: 90    Temp: 98.4 F (36.9 C)  TempSrc: Oral  Resp: 18  Height: 5\' 3"  (1.6 m)  Weight: 176 lb 6.4 oz (80.015 kg)  SpO2: 90%    Physical Exam  Constitutional: She is oriented to person, place, and time. She appears well-developed and well-nourished. No distress.  HENT:  Head: Normocephalic and atraumatic.  Right Ear: External ear normal.  Left Ear: External ear normal.  Nose: Nose normal.  Mouth/Throat: Oropharynx is clear and moist. No oropharyngeal exudate.  Eyes: Conjunctivae and EOM are normal. Pupils are equal, round, and reactive to light.  Neck: Normal range of motion. Neck supple.  Cardiovascular: Normal rate, regular rhythm and normal heart sounds.   Pulmonary/Chest: Effort normal and breath sounds normal.  Abdominal: Soft. Bowel sounds are normal. She exhibits no distension. There is no tenderness.  Genitourinary: Vagina normal and uterus normal. No vaginal discharge found.  Musculoskeletal: Normal range of motion. She exhibits no edema or tenderness.  Neurological: She is alert and oriented to person, place, and time. She has normal reflexes.  Skin: Skin is warm and dry. She is not diaphoretic.  Psychiatric: She has a normal mood and affect.    Labs reviewed: Basic Metabolic Panel: No results for input(s): NA, K, CL, CO2, GLUCOSE, BUN, CREATININE, CALCIUM, MG, PHOS, TSH in the last 8760 hours. Liver Function Tests: No results for input(s): AST, ALT, ALKPHOS, BILITOT, PROT, ALBUMIN in the last 8760 hours. No results for input(s): LIPASE, AMYLASE in the last 8760 hours. No results for input(s): AMMONIA in the last 8760 hours. CBC: No results for input(s): WBC, NEUTROABS, HGB, HCT, MCV, PLT in the last 8760 hours. Lipid Panel: No results for input(s): CHOL, HDL, LDLCALC, TRIG, CHOLHDL, LDLDIRECT in the last 8760 hours. TSH: No results for input(s): TSH in the last 8760 hours. A1C: No results found for: HGBA1C   Assessment/Plan 1. Preventative health care -exam  normal, PREVENTIVE COUNSELING done, The patient was counseled regarding the appropriate use of alcohol, regular self-examination of the breasts on a monthly basis, prevention of dental and periodontal disease, diet, regular sustained exercise for at least 30 minutes 5 times per week, routine screening interval for mammogram as recommended by the American Cancer Society and ACOG, importance of regular PAP smears, smoking cessation, tobacco use,  and recommended schedule for GI hemoccult testing, colonoscopy, cholesterol, thyroid and diabetes screening. - CBC with Differential - Comprehensive metabolic panel - Lipid panel  2. Intractable migraine with aura without status migrainosus - reports headaches most days,  pt has tired propranolol in the past routinely without effects, sumatriptan helped very little and she had to keep taking it so she stopped medication all together. Will referral to headache clinic at this time  3. Breast cancer screening - MM DIGITAL SCREENING BILATERAL; Future  Follow up in 1 years, sooner if needed

## 2014-06-19 NOTE — Telephone Encounter (Signed)
Spoke with patient says she had tetanus shot done 4 years ago and will get documentation for us to chart.

## 2014-06-19 NOTE — Patient Instructions (Signed)
Heart healthy diet exercise 30 mins/ 5 days a week  Referral made for headache specialist  Need mammogram   Follow up in 1 year, sooner if needed    Health Maintenance Adopting a healthy lifestyle and getting preventive care can go a long way to promote health and wellness. Talk with your health care provider about what schedule of regular examinations is right for you. This is a good chance for you to check in with your provider about disease prevention and staying healthy. In between checkups, there are plenty of things you can do on your own. Experts have done a lot of research about which lifestyle changes and preventive measures are most likely to keep you healthy. Ask your health care provider for more information. WEIGHT AND DIET  Eat a healthy diet  Be sure to include plenty of vegetables, fruits, low-fat dairy products, and lean protein.  Do not eat a lot of foods high in solid fats, added sugars, or salt.  Get regular exercise. This is one of the most important things you can do for your health.  Most adults should exercise for at least 150 minutes each week. The exercise should increase your heart rate and make you sweat (moderate-intensity exercise).  Most adults should also do strengthening exercises at least twice a week. This is in addition to the moderate-intensity exercise.  Maintain a healthy weight  Body mass index (BMI) is a measurement that can be used to identify possible weight problems. It estimates body fat based on height and weight. Your health care provider can help determine your BMI and help you achieve or maintain a healthy weight.  For females 58 years of age and older:   A BMI below 18.5 is considered underweight.  A BMI of 18.5 to 24.9 is normal.  A BMI of 25 to 29.9 is considered overweight.  A BMI of 30 and above is considered obese.  Watch levels of cholesterol and blood lipids  You should start having your blood tested for lipids and  cholesterol at 47 years of age, then have this test every 5 years.  You may need to have your cholesterol levels checked more often if:  Your lipid or cholesterol levels are high.  You are older than 47 years of age.  You are at high risk for heart disease.  CANCER SCREENING   Lung Cancer  Lung cancer screening is recommended for adults 91-59 years old who are at high risk for lung cancer because of a history of smoking.  A yearly low-dose CT scan of the lungs is recommended for people who:  Currently smoke.  Have quit within the past 15 years.  Have at least a 30-pack-year history of smoking. A pack year is smoking an average of one pack of cigarettes a day for 1 year.  Yearly screening should continue until it has been 15 years since you quit.  Yearly screening should stop if you develop a health problem that would prevent you from having lung cancer treatment.  Breast Cancer  Practice breast self-awareness. This means understanding how your breasts normally appear and feel.  It also means doing regular breast self-exams. Let your health care provider know about any changes, no matter how small.  If you are in your 20s or 30s, you should have a clinical breast exam (CBE) by a health care provider every 1-3 years as part of a regular health exam.  If you are 44 or older, have a CBE every year.  Also consider having a breast X-ray (mammogram) every year.  If you have a family history of breast cancer, talk to your health care provider about genetic screening.  If you are at high risk for breast cancer, talk to your health care provider about having an MRI and a mammogram every year.  Breast cancer gene (BRCA) assessment is recommended for women who have family members with BRCA-related cancers. BRCA-related cancers include:  Breast.  Ovarian.  Tubal.  Peritoneal cancers.  Results of the assessment will determine the need for genetic counseling and BRCA1 and BRCA2  testing. Cervical Cancer Routine pelvic examinations to screen for cervical cancer are no longer recommended for nonpregnant women who are considered low risk for cancer of the pelvic organs (ovaries, uterus, and vagina) and who do not have symptoms. A pelvic examination may be necessary if you have symptoms including those associated with pelvic infections. Ask your health care provider if a screening pelvic exam is right for you.   The Pap test is the screening test for cervical cancer for women who are considered at risk.  If you had a hysterectomy for a problem that was not cancer or a condition that could lead to cancer, then you no longer need Pap tests.  If you are older than 65 years, and you have had normal Pap tests for the past 10 years, you no longer need to have Pap tests.  If you have had past treatment for cervical cancer or a condition that could lead to cancer, you need Pap tests and screening for cancer for at least 20 years after your treatment.  If you no longer get a Pap test, assess your risk factors if they change (such as having a new sexual partner). This can affect whether you should start being screened again.  Some women have medical problems that increase their chance of getting cervical cancer. If this is the case for you, your health care provider may recommend more frequent screening and Pap tests.  The human papillomavirus (HPV) test is another test that may be used for cervical cancer screening. The HPV test looks for the virus that can cause cell changes in the cervix. The cells collected during the Pap test can be tested for HPV.  The HPV test can be used to screen women 101 years of age and older. Getting tested for HPV can extend the interval between normal Pap tests from three to five years.  An HPV test also should be used to screen women of any age who have unclear Pap test results.  After 47 years of age, women should have HPV testing as often as Pap  tests.  Colorectal Cancer  This type of cancer can be detected and often prevented.  Routine colorectal cancer screening usually begins at 47 years of age and continues through 47 years of age.  Your health care provider may recommend screening at an earlier age if you have risk factors for colon cancer.  Your health care provider may also recommend using home test kits to check for hidden blood in the stool.  A small camera at the end of a tube can be used to examine your colon directly (sigmoidoscopy or colonoscopy). This is done to check for the earliest forms of colorectal cancer.  Routine screening usually begins at age 17.  Direct examination of the colon should be repeated every 5-10 years through 47 years of age. However, you may need to be screened more often if early forms  of precancerous polyps or small growths are found. Skin Cancer  Check your skin from head to toe regularly.  Tell your health care provider about any new moles or changes in moles, especially if there is a change in a mole's shape or color.  Also tell your health care provider if you have a mole that is larger than the size of a pencil eraser.  Always use sunscreen. Apply sunscreen liberally and repeatedly throughout the day.  Protect yourself by wearing long sleeves, pants, a wide-brimmed hat, and sunglasses whenever you are outside. HEART DISEASE, DIABETES, AND HIGH BLOOD PRESSURE   Have your blood pressure checked at least every 1-2 years. High blood pressure causes heart disease and increases the risk of stroke.  If you are between 17 years and 82 years old, ask your health care provider if you should take aspirin to prevent strokes.  Have regular diabetes screenings. This involves taking a blood sample to check your fasting blood sugar level.  If you are at a normal weight and have a low risk for diabetes, have this test once every three years after 47 years of age.  If you are overweight and  have a high risk for diabetes, consider being tested at a younger age or more often. PREVENTING INFECTION  Hepatitis B  If you have a higher risk for hepatitis B, you should be screened for this virus. You are considered at high risk for hepatitis B if:  You were born in a country where hepatitis B is common. Ask your health care provider which countries are considered high risk.  Your parents were born in a high-risk country, and you have not been immunized against hepatitis B (hepatitis B vaccine).  You have HIV or AIDS.  You use needles to inject street drugs.  You live with someone who has hepatitis B.  You have had sex with someone who has hepatitis B.  You get hemodialysis treatment.  You take certain medicines for conditions, including cancer, organ transplantation, and autoimmune conditions. Hepatitis C  Blood testing is recommended for:  Everyone born from [birth year] through 1963.  Anyone with known risk factors for hepatitis C. Sexually transmitted infections (STIs)  You should be screened for sexually transmitted infections (STIs) including gonorrhea and chlamydia if:  You are sexually active and are younger than 47 years of age.  You are older than 47 years of age and your health care provider tells you that you are at risk for this type of infection.  Your sexual activity has changed since you were last screened and you are at an increased risk for chlamydia or gonorrhea. Ask your health care provider if you are at risk.  If you do not have HIV, but are at risk, it may be recommended that you take a prescription medicine daily to prevent HIV infection. This is called pre-exposure prophylaxis (PrEP). You are considered at risk if:  You are sexually active and do not regularly use condoms or know the HIV status of your partner(s).  You take drugs by injection.  You are sexually active with a partner who has HIV. Talk with your health care provider about whether you  are at high risk of being infected with HIV. If you choose to begin PrEP, you should first be tested for HIV. You should then be tested every 3 months for as long as you are taking PrEP.  PREGNANCY   If you are premenopausal and you may become pregnant, ask your health  care provider about preconception counseling.  If you may become pregnant, take 400 to 800 micrograms (mcg) of folic acid every day.  If you want to prevent pregnancy, talk to your health care provider about birth control (contraception). OSTEOPOROSIS AND MENOPAUSE   Osteoporosis is a disease in which the bones lose minerals and strength with aging. This can result in serious bone fractures. Your risk for osteoporosis can be identified using a bone density scan.  If you are 50 years of age or older, or if you are at risk for osteoporosis and fractures, ask your health care provider if you should be screened.  Ask your health care provider whether you should take a calcium or vitamin D supplement to lower your risk for osteoporosis.  Menopause may have certain physical symptoms and risks.  Hormone replacement therapy may reduce some of these symptoms and risks. Talk to your health care provider about whether hormone replacement therapy is right for you.  HOME CARE INSTRUCTIONS   Schedule regular health, dental, and eye exams.  Stay current with your immunizations.   Do not use any tobacco products including cigarettes, chewing tobacco, or electronic cigarettes.  If you are pregnant, do not drink alcohol.  If you are breastfeeding, limit how much and how often you drink alcohol.  Limit alcohol intake to no more than 1 drink per day for nonpregnant women. One drink equals 12 ounces of beer, 5 ounces of wine, or 1 ounces of hard liquor.  Do not use street drugs.  Do not share needles.  Ask your health care provider for help if you need support or information about quitting drugs.  Tell your health care provider if  you often feel depressed.  Tell your health care provider if you have ever been abused or do not feel safe at home. Document Released: 07/14/2010 Document Revised: 05/15/2013 Document Reviewed: 11/30/2012 [facility] Patient Information 2015 [place], [place]. This information is not intended to replace advice given to you by your health care provider. Make sure you discuss any questions you have with your health care provider.

## 2014-06-20 LAB — COMPREHENSIVE METABOLIC PANEL
ALT: 9 IU/L (ref 0–32)
AST: 16 IU/L (ref 0–40)
Albumin/Globulin Ratio: 1.4 (ref 1.1–2.5)
Albumin: 3.8 g/dL (ref 3.5–5.5)
Alkaline Phosphatase: 81 IU/L (ref 39–117)
BUN/Creatinine Ratio: 11 (ref 9–23)
BUN: 9 mg/dL (ref 6–24)
CHLORIDE: 103 mmol/L (ref 97–108)
CO2: 22 mmol/L (ref 18–29)
CREATININE: 0.79 mg/dL (ref 0.57–1.00)
Calcium: 8.8 mg/dL (ref 8.7–10.2)
GFR calc Af Amer: 104 mL/min/{1.73_m2} (ref >59)
GFR calc non Af Amer: 90 mL/min/{1.73_m2} (ref >59)
Globulin, Total: 2.8 g/dL (ref 1.5–4.5)
Glucose: 85 mg/dL (ref 65–99)
POTASSIUM: 4.5 mmol/L (ref 3.5–5.2)
Sodium: 138 mmol/L (ref 134–144)
Total Protein: 6.6 g/dL (ref 6.0–8.5)

## 2014-06-20 LAB — CBC WITH DIFFERENTIAL/PLATELET
Basophils Absolute: 0 10*3/uL (ref 0.0–0.2)
Basos: 0 %
EOS (ABSOLUTE): 0.1 10*3/uL (ref 0.0–0.4)
Eos: 3 %
HEMOGLOBIN: 10.6 g/dL — AB (ref 11.1–15.9)
Hematocrit: 32 % — ABNORMAL LOW (ref 34.0–46.6)
Immature Grans (Abs): 0 10*3/uL (ref 0.0–0.1)
Immature Granulocytes: 0 %
LYMPHS ABS: 1.8 10*3/uL (ref 0.7–3.1)
Lymphs: 36 %
MCH: 27.2 pg (ref 26.6–33.0)
MCHC: 33.1 g/dL (ref 31.5–35.7)
MCV: 82 fL (ref 79–97)
MONOCYTES: 9 %
MONOS ABS: 0.4 10*3/uL (ref 0.1–0.9)
NEUTROS PCT: 52 %
Neutrophils Absolute: 2.6 10*3/uL (ref 1.4–7.0)
PLATELETS: 385 10*3/uL — AB (ref 150–379)
RBC: 3.89 x10E6/uL (ref 3.77–5.28)
RDW: 14.6 % (ref 12.3–15.4)
WBC: 5 10*3/uL (ref 3.4–10.8)

## 2014-06-20 LAB — LIPID PANEL
Chol/HDL Ratio: 1.9 ratio units (ref 0.0–4.4)
Cholesterol, Total: 198 mg/dL (ref 100–199)
HDL: 104 mg/dL (ref >39)
LDL CALC: 84 mg/dL (ref 0–99)
TRIGLYCERIDES: 49 mg/dL (ref 0–149)
VLDL Cholesterol Cal: 10 mg/dL (ref 5–40)

## 2014-06-21 DIAGNOSIS — D649 Anemia, unspecified: Secondary | ICD-10-CM

## 2014-06-21 HISTORY — DX: Anemia, unspecified: D64.9

## 2014-06-26 ENCOUNTER — Telehealth: Payer: Self-pay

## 2014-06-26 DIAGNOSIS — D649 Anemia, unspecified: Secondary | ICD-10-CM

## 2014-06-26 NOTE — Telephone Encounter (Signed)
Spoke with patient about labs, shows anemia, repeat labs in 3 months 09/24/14

## 2014-06-28 ENCOUNTER — Ambulatory Visit: Payer: Self-pay

## 2014-06-28 LAB — GYN REPORT: PAP Smear Comment: 0

## 2014-06-28 LAB — SPECIMEN STATUS REPORT

## 2014-07-05 ENCOUNTER — Ambulatory Visit
Admission: RE | Admit: 2014-07-05 | Discharge: 2014-07-05 | Disposition: A | Discharge status: Home / Self Care | Payer: 59 | Source: Ambulatory Visit | Attending: Nurse Practitioner | Admitting: Nurse Practitioner

## 2014-07-05 DIAGNOSIS — Z1239 Encounter for other screening for malignant neoplasm of breast: Secondary | ICD-10-CM

## 2014-07-09 ENCOUNTER — Other Ambulatory Visit: Payer: Self-pay | Admitting: Nurse Practitioner

## 2014-07-09 DIAGNOSIS — R928 Other abnormal and inconclusive findings on diagnostic imaging of breast: Secondary | ICD-10-CM

## 2014-07-13 ENCOUNTER — Ambulatory Visit
Admission: RE | Admit: 2014-07-13 | Discharge: 2014-07-13 | Disposition: A | Discharge status: Home / Self Care | Payer: 59 | Source: Ambulatory Visit | Attending: Nurse Practitioner | Admitting: Nurse Practitioner

## 2014-07-13 DIAGNOSIS — R928 Other abnormal and inconclusive findings on diagnostic imaging of breast: Secondary | ICD-10-CM

## 2014-08-23 ENCOUNTER — Telehealth: Payer: Self-pay | Admitting: Nurse Practitioner

## 2014-08-23 NOTE — Telephone Encounter (Signed)
FYI - [facility] our office has called patient multiple times to schedule an appointment.  Patient was also sent a letter by both offices and has not responded to multiple attempts to contact her about this referral

## 2014-09-24 ENCOUNTER — Other Ambulatory Visit: Payer: 59

## 2015-07-08 ENCOUNTER — Telehealth: Payer: Self-pay

## 2015-07-08 NOTE — Telephone Encounter (Signed)
I called patient to find out if she wanted to schedule a lab appointment and annual visit. I left a message for patient to call the office.
